# Patient Record
Sex: Female | Born: 1990 | Race: White | Hispanic: No | Marital: Single | Smoking: Never smoker
Health system: Southern US, Community
[De-identification: ages and names within clinical notes are randomized; demographics above are authoritative.]

## PROBLEM LIST (undated history)

## (undated) HISTORY — PX: WISDOM TOOTH EXTRACTION: SHX21

## (undated) HISTORY — PX: TONSILLECTOMY AND ADENOIDECTOMY: SUR1326

## (undated) HISTORY — PX: TYMPANOSTOMY TUBE PLACEMENT: SHX32

---

## 2013-09-05 ENCOUNTER — Emergency Department (HOSPITAL_COMMUNITY): Payer: Managed Care, Other (non HMO)

## 2013-09-05 ENCOUNTER — Encounter (HOSPITAL_COMMUNITY): Payer: Self-pay | Admitting: Emergency Medicine

## 2013-09-05 ENCOUNTER — Emergency Department (HOSPITAL_COMMUNITY)
Admission: EM | Admit: 2013-09-05 | Discharge: 2013-09-05 | Disposition: A | Discharge status: Home / Self Care | Payer: Managed Care, Other (non HMO) | Attending: Emergency Medicine | Admitting: Emergency Medicine

## 2013-09-05 DIAGNOSIS — IMO0002 Reserved for concepts with insufficient information to code with codable children: Secondary | ICD-10-CM | POA: Diagnosis not present

## 2013-09-05 DIAGNOSIS — S0993XA Unspecified injury of face, initial encounter: Secondary | ICD-10-CM | POA: Diagnosis not present

## 2013-09-05 DIAGNOSIS — Y92838 Other recreation area as the place of occurrence of the external cause: Secondary | ICD-10-CM | POA: Diagnosis not present

## 2013-09-05 DIAGNOSIS — S199XXA Unspecified injury of neck, initial encounter: Secondary | ICD-10-CM | POA: Diagnosis not present

## 2013-09-05 DIAGNOSIS — Y9239 Other specified sports and athletic area as the place of occurrence of the external cause: Secondary | ICD-10-CM | POA: Insufficient documentation

## 2013-09-05 DIAGNOSIS — Y9352 Activity, horseback riding: Secondary | ICD-10-CM | POA: Diagnosis not present

## 2013-09-05 DIAGNOSIS — W19XXXA Unspecified fall, initial encounter: Secondary | ICD-10-CM

## 2013-09-05 DIAGNOSIS — S0990XA Unspecified injury of head, initial encounter: Secondary | ICD-10-CM | POA: Diagnosis not present

## 2013-09-05 MED ORDER — OXYCODONE-ACETAMINOPHEN 5-325 MG PO TABS
1.0000 | ORAL_TABLET | Freq: Once | ORAL | Status: AC
  Administered 2013-09-05: 1 via ORAL
  Filled 2013-09-05: qty 1

## 2013-09-05 MED ORDER — HYDROCODONE-ACETAMINOPHEN 5-325 MG PO TABS: 1.0000 | ORAL_TABLET | Freq: Four times a day (QID) | ORAL | Status: AC | PRN

## 2013-09-05 NOTE — ED Notes (Signed)
PT was wearing a hard helmet.

## 2013-09-05 NOTE — ED Notes (Signed)
PT returned from CT

## 2013-09-05 NOTE — ED Provider Notes (Signed)
CSN: [id number]     Arrival date & time 09/05/13  1308 History  This chart was scribed for non-physician practitioner, Santiago Glad, PA-C, working with Mirian Mo, MD by Charline Bills, ED Scribe. This patient was seen in room TR09C/TR09C and the patient's care was started at 2:34 PM.   Chief Complaint  Patient presents with  . Fall   The history is provided by the patient. No language interpreter was used.   HPI Comments: Renee Pollard is a 23 y.o. female who presents to the Emergency Department complaining of fall that occurred at 11 AM today. Pt states that she fell off a horse and hit her head. No LOC. She reports associated coccyx pain, HA, nausea, and fatigue. She also reports that initially after the injury she had neck pain, difficulty speaking, confusion and facial tingling that has since resolved. She denies emesis. Pt reports h/o concussion from similar fall.  She reports that she has been ambulatory since the injury.  Denies extremity pain.   She is currently not on any anticoagulants.    History reviewed. No pertinent past medical history. Past Surgical History  Procedure Laterality Date  . Tonsillectomy and adenoidectomy    . Tympanostomy tube placement    . Wisdom tooth extraction     History reviewed. No pertinent family history. History  Substance Use Topics  . Smoking status: Never Smoker   . Smokeless tobacco: Not on file  . Alcohol Use: Yes     Comment: 4/wk   OB History   Grav Para Term Preterm Abortions TAB SAB Ect Mult Living                 Review of Systems  Constitutional: Positive for fatigue.  Eyes: Positive for visual disturbance.  Gastrointestinal: Positive for nausea. Negative for vomiting.  Musculoskeletal: Positive for myalgias and neck pain (prior).  Neurological: Positive for speech difficulty (resolved) and headaches. Negative for syncope.  Psychiatric/Behavioral: Positive for confusion (resolved).  All other systems reviewed and are  negative.  Allergies  Review of patient's allergies indicates no known allergies.  Home Medications   Prior to Admission medications   Not on File   Triage Vitals: BP 139/79  Pulse 71  Temp(Src) 97.9 F (36.6 C) (Oral)  SpO2 99%  LMP 08/14/2013 Physical Exam  Nursing note and vitals reviewed. Constitutional: She is oriented to person, place, and time. She appears well-developed and well-nourished.  HENT:  Head: Normocephalic and atraumatic.  Eyes: Conjunctivae and EOM are normal. Pupils are equal, round, and reactive to light.  Neck: Normal range of motion. Neck supple.  Cardiovascular: Normal rate, regular rhythm and normal heart sounds.   Pulmonary/Chest: Effort normal and breath sounds normal.  Musculoskeletal: Normal range of motion.       Cervical back: Normal. She exhibits normal range of motion, no tenderness, no bony tenderness, no swelling, no edema and no deformity.       Thoracic back: Normal. She exhibits normal range of motion, no tenderness, no bony tenderness, no swelling, no edema and no deformity.       Lumbar back: Normal. She exhibits normal range of motion, no tenderness, no bony tenderness, no swelling, no edema and no deformity.  No tenderness to palpation over cervical, thoracic and lumbar spine Tender to palpation over coccyx   Neurological: She is alert and oriented to person, place, and time. No cranial nerve deficit.  Distal sensation intact bilaterally  Muscle strength is normal in upper and lower extremities  Cranial nerves intact  Skin: Skin is warm and dry.  Psychiatric: She has a normal mood and affect. Her behavior is normal.   ED Course  Procedures (including critical care time) DIAGNOSTIC STUDIES: Oxygen Saturation is 99% on RA, normal by my interpretation.    COORDINATION OF CARE: 2:40 PM-Discussed treatment plan which includes CT and XR with pt at bedside and pt agreed to plan.   Labs Review Labs Reviewed - No data to  display  Imaging Review Dg Sacrum/coccyx  09/05/2013   CLINICAL DATA:  Fall from horse with sacral pain.  EXAM: SACRUM AND COCCYX - 2+ VIEW  COMPARISON:  None.  FINDINGS: The sacrum and coccyx show normal alignment without evidence of fracture. Sacroiliac joints are symmetric and normal. No bony lesions are seen. Soft tissues are unremarkable.  IMPRESSION: No evidence of acute fracture involving the sacrum or coccyx.   Electronically Signed   By: Irish Lack M.D.   On: 09/05/2013 15:33   Ct Head Wo Contrast  09/05/2013   CLINICAL DATA:  Status post fall striking her head without loss of consciousness; peripheral visual disturbance as well as headache and nausea  EXAM: CT HEAD WITHOUT CONTRAST  TECHNIQUE: Contiguous axial images were obtained from the base of the skull through the vertex without intravenous contrast.  COMPARISON:  None.  FINDINGS: The ventricles are normal in size and position. There is no intracranial hemorrhage nor intracranial mass effect. No acute ischemic changes are demonstrated. The cerebellum and brainstem are normal.  The observed paranasal sinuses and mastoid air cells are clear. There is no acute skull fracture. There is no cephalohematoma.  IMPRESSION: There is no acute intracranial abnormality.   Electronically Signed   By: David  [place]   On: 09/05/2013 15:25    EKG Interpretation None      MDM   Final diagnoses:  None   Patient presenting with headache and coccyx pain after falling off of a horse.  Normal neurological exam.  Head CT is negative.  Coccyx xray negative.  Patient able to ambulate.  Patient stable for discharge.  Return precautions given.    I personally performed the services described in this documentation, which was scribed in my presence. The recorded information has been reviewed and is accurate.    Santiago Glad, PA-C 09/05/13 1601

## 2013-09-05 NOTE — ED Notes (Signed)
Pt arrives via POV from with fall this AM from a horse. Pt denies LOC with the fall. States while driving she missed several turns, was having difficulty focusing while texting. Reports symptoms have improved while waiting here. States she's nauseated, denies vomiting. Hx of concussion. Currently awake, alert, oriented x4, VSS.

## 2013-09-05 NOTE — ED Notes (Signed)
PT ambulated with baseline gait; VSS; A&Ox3; no signs of distress; respirations even and unlabored; skin warm and dry; no questions upon discharge.  

## 2013-09-06 NOTE — ED Provider Notes (Signed)
Medical screening examination/treatment/procedure(s) were performed by non-physician practitioner and as supervising physician I was immediately available for consultation/collaboration.   EKG Interpretation None        Clinton Wahlberg, MD 09/06/13 1426 

## 2013-09-08 ENCOUNTER — Emergency Department (HOSPITAL_COMMUNITY)
Admission: EM | Admit: 2013-09-08 | Discharge: 2013-09-08 | Disposition: A | Discharge status: Home / Self Care | Payer: Managed Care, Other (non HMO) | Attending: Emergency Medicine | Admitting: Emergency Medicine

## 2013-09-08 ENCOUNTER — Encounter (HOSPITAL_COMMUNITY): Payer: Self-pay | Admitting: Emergency Medicine

## 2013-09-08 DIAGNOSIS — Z87828 Personal history of other (healed) physical injury and trauma: Secondary | ICD-10-CM | POA: Diagnosis not present

## 2013-09-08 DIAGNOSIS — H539 Unspecified visual disturbance: Secondary | ICD-10-CM | POA: Diagnosis not present

## 2013-09-08 DIAGNOSIS — Z09 Encounter for follow-up examination after completed treatment for conditions other than malignant neoplasm: Secondary | ICD-10-CM | POA: Diagnosis present

## 2013-09-08 NOTE — ED Notes (Signed)
Pt placed into room. Pt changing into gown. Elon Jester, RN made aware pt is in room.

## 2013-09-08 NOTE — ED Provider Notes (Signed)
CSN: [id number]     Arrival date & time 09/08/13  1059 History   First MD Initiated Contact with Patient 09/08/13 1135     Chief Complaint  Patient presents with  . Follow-up     (Consider location/radiation/quality/duration/timing/severity/associated sxs/prior Treatment) The history is provided by the patient and medical records.   This is a 23 year old female with no significant past medical history presenting to the ED for visual disturbance. Patient states she was seen in the ED on 09/05/2013 after falling off of horse and hitting the back of her head. She denied loss of consciousness. She had a CT performed at that time which was negative and told that she likely had a concussion. States that this time she has been having intermittent visual changes in her right eye.  States "i don't feel like my right eye is working like my left eye.  It feels like it is straining."  Denies acute vision loss.  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  States eye feels more strained when reading or trying to focus, ie- reading text book or texting on cell phone.  She does have a current headache but has not taken her pain medication today.  She denies and tinnitus, confusion, changes in speech, difficulty walking, aphasia, fever, chills, or neck pain.  Patient not currently on any anti-coagulants.  VS stable on arrival.  History reviewed. No pertinent past medical history. Past Surgical History  Procedure Laterality Date  . Tonsillectomy and adenoidectomy    . Tympanostomy tube placement    . Wisdom tooth extraction     No family history on file. History  Substance Use Topics  . Smoking status: Never Smoker   . Smokeless tobacco: Not on file  . Alcohol Use: Yes     Comment: 4/wk   OB History   Grav Para Term Preterm Abortions TAB SAB Ect Mult Living                 Review of Systems  Eyes: Positive for  visual disturbance.  All other systems reviewed and are negative.     Allergies  Review of patient's allergies indicates no known allergies.  Home Medications   Prior to Admission medications   Medication Sig Start Date End Date Taking? Authorizing Provider  HYDROcodone-acetaminophen (NORCO/VICODIN) 5-325 MG per tablet Take 1-2 tablets by mouth every 6 (six) hours as needed. 09/05/13   Heather Laisure, PA-C   BP 139/85  Pulse 71  Temp(Src) 98.5 F (36.9 C) (Oral)  Resp 20  Ht  (1.575 m)  Wt 135 lb (61.236 kg)  BMI 24.69 kg/m2  SpO2 99%  LMP 08/14/2013  Physical Exam  Nursing note and vitals reviewed. Constitutional: She is oriented to person, place, and time. She appears well-developed and well-nourished.  HENT:  Head: Normocephalic and atraumatic.  Mouth/Throat: Oropharynx is clear and moist.  Eyes: Conjunctivae, EOM and lids are normal. Pupils are equal, round, and reactive to light.  PERRL, EOM intact and non-painful, normal confrontation bilaterally, no noted visual field cuts, visual acuity Bilateral Near: 20/20 ; Bilateral Distance: 20/20 ; R Near: 20/40 ; R Distance: 20/40 ; L Near: 20/40 ; L Distance: 20/40  Neck: Normal range of motion.  Cardiovascular: Normal rate, regular rhythm and normal heart sounds.   Pulmonary/Chest: Effort normal and breath sounds normal. No respiratory distress. She has no wheezes.  Abdominal: Soft. Bowel sounds are normal. There is no tenderness. There is no guarding.  Musculoskeletal: Normal range of motion.  Neurological: She is alert and oriented to person, place, and time.  AAOx3, answering questions appropriately; equal strength UE and LE bilaterally; CN grossly intact; moves all extremities appropriately without ataxia; no focal neuro deficits or facial asymmetry appreciated  Skin: Skin is warm and dry.  Psychiatric: She has a normal mood and affect.    ED Course  Procedures (including critical care time) Labs Review Labs  Reviewed - No data to display  Imaging Review No results found.   EKG Interpretation None      MDM   Final diagnoses:  Visual disturbance of one eye   23 year old female 3 days status post head injury. Negative CT on initial visit, likely concussion. Complains of intermittent right eye strain since accident, worse with attempting to read book or texts messages.  No loss of vision noted. Neurologic exam today is nonfocal. She has no visual field cuts and has normal visual acuity.  Do not feel further work-up indicated at this time. Patient does wear contact lenses, but has not been evaluated an ophthalmologist since moving to [facility] for college. I have referred her to a local clinic for further evaluation if symptoms persist.  Discussed plan with patient, he/she acknowledged understanding and agreed with plan of care.  Return precautions given for new or worsening symptoms.  Garlon Hatchet, PA-C 09/08/13 1444

## 2013-09-08 NOTE — ED Provider Notes (Signed)
Medical screening examination/treatment/procedure(s) were performed by non-physician practitioner and as supervising physician I was immediately available for consultation/collaboration.   EKG Interpretation None        Joya Gaskins, MD 09/08/13 1504

## 2013-09-08 NOTE — ED Notes (Signed)
Patient states she was here last week after falling from horse and hitting the back of her head.  Patient states that she had a concussion and since that time has had vision changes with R eye.   Patient states "my eyes aren't working together".   Patient states still has bad headache, but is frontal headache at this time.

## 2013-09-08 NOTE — Discharge Instructions (Signed)
Follow-up with Dr. Charlotte Sanes if you continue having problems with your right eye. Return to the ED for new concerns.

## 2013-09-08 NOTE — ED Notes (Signed)
Pt upset that provider would not write pt can not take school test tomorrow pt was given a school note with no restrictions

## 2016-03-12 IMAGING — CR DG SACRUM/COCCYX 2+V
3 series · 3 of 3 positions shown · non-contrast
Comparison: None.

CLINICAL DATA: Fall from horse with sacral pain.

EXAM:
SACRUM AND COCCYX - 2+ VIEW

[t sacrum a.p.]
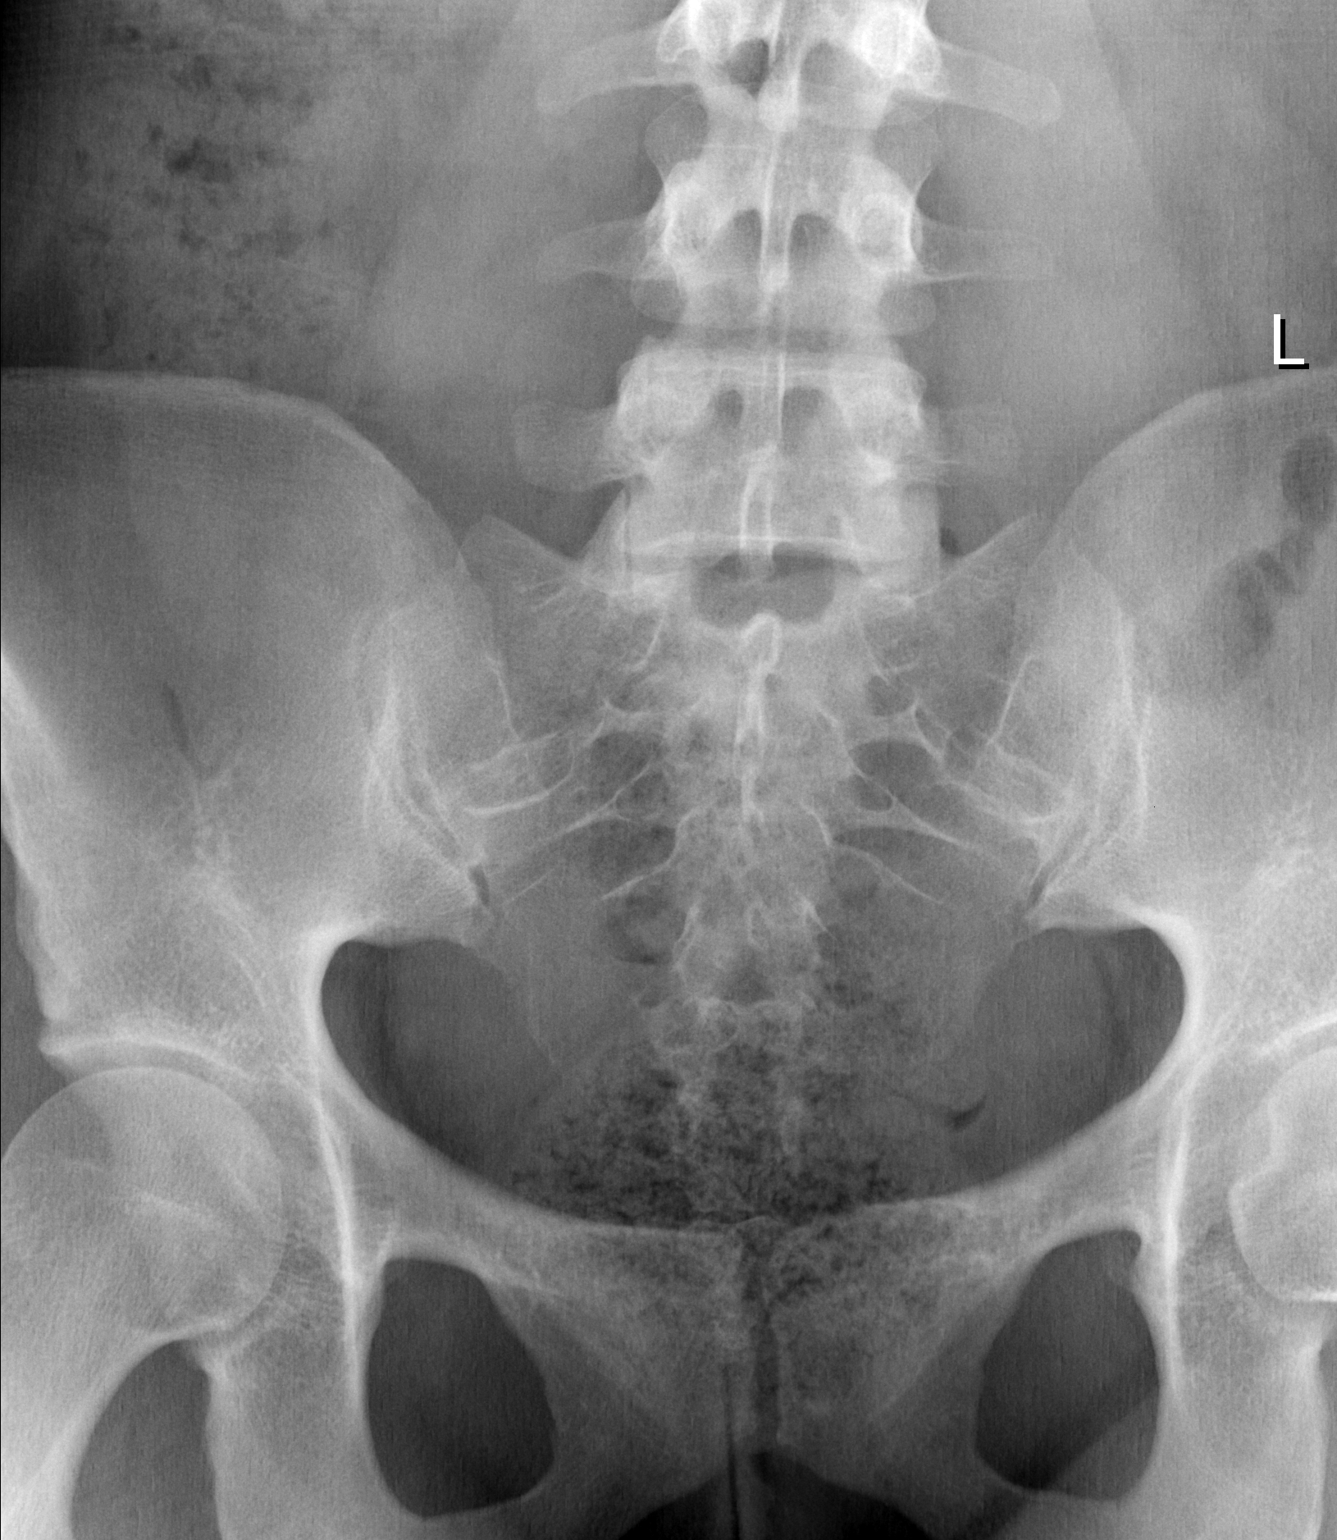

[t sacrum lat]
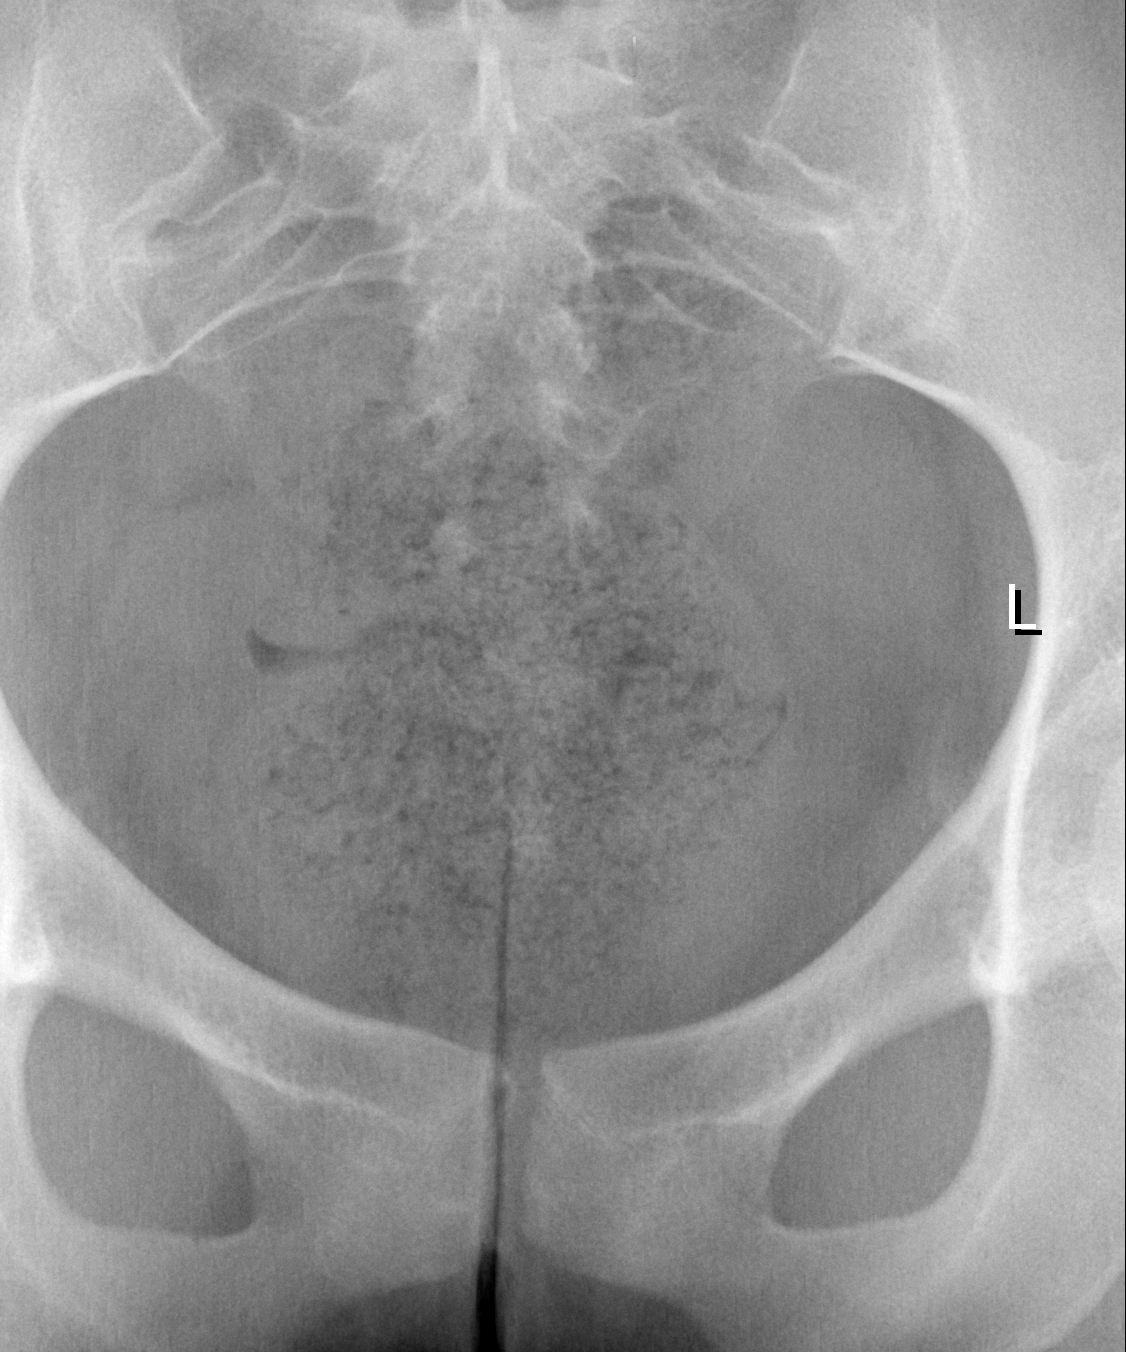

[t coccyx a.p.]
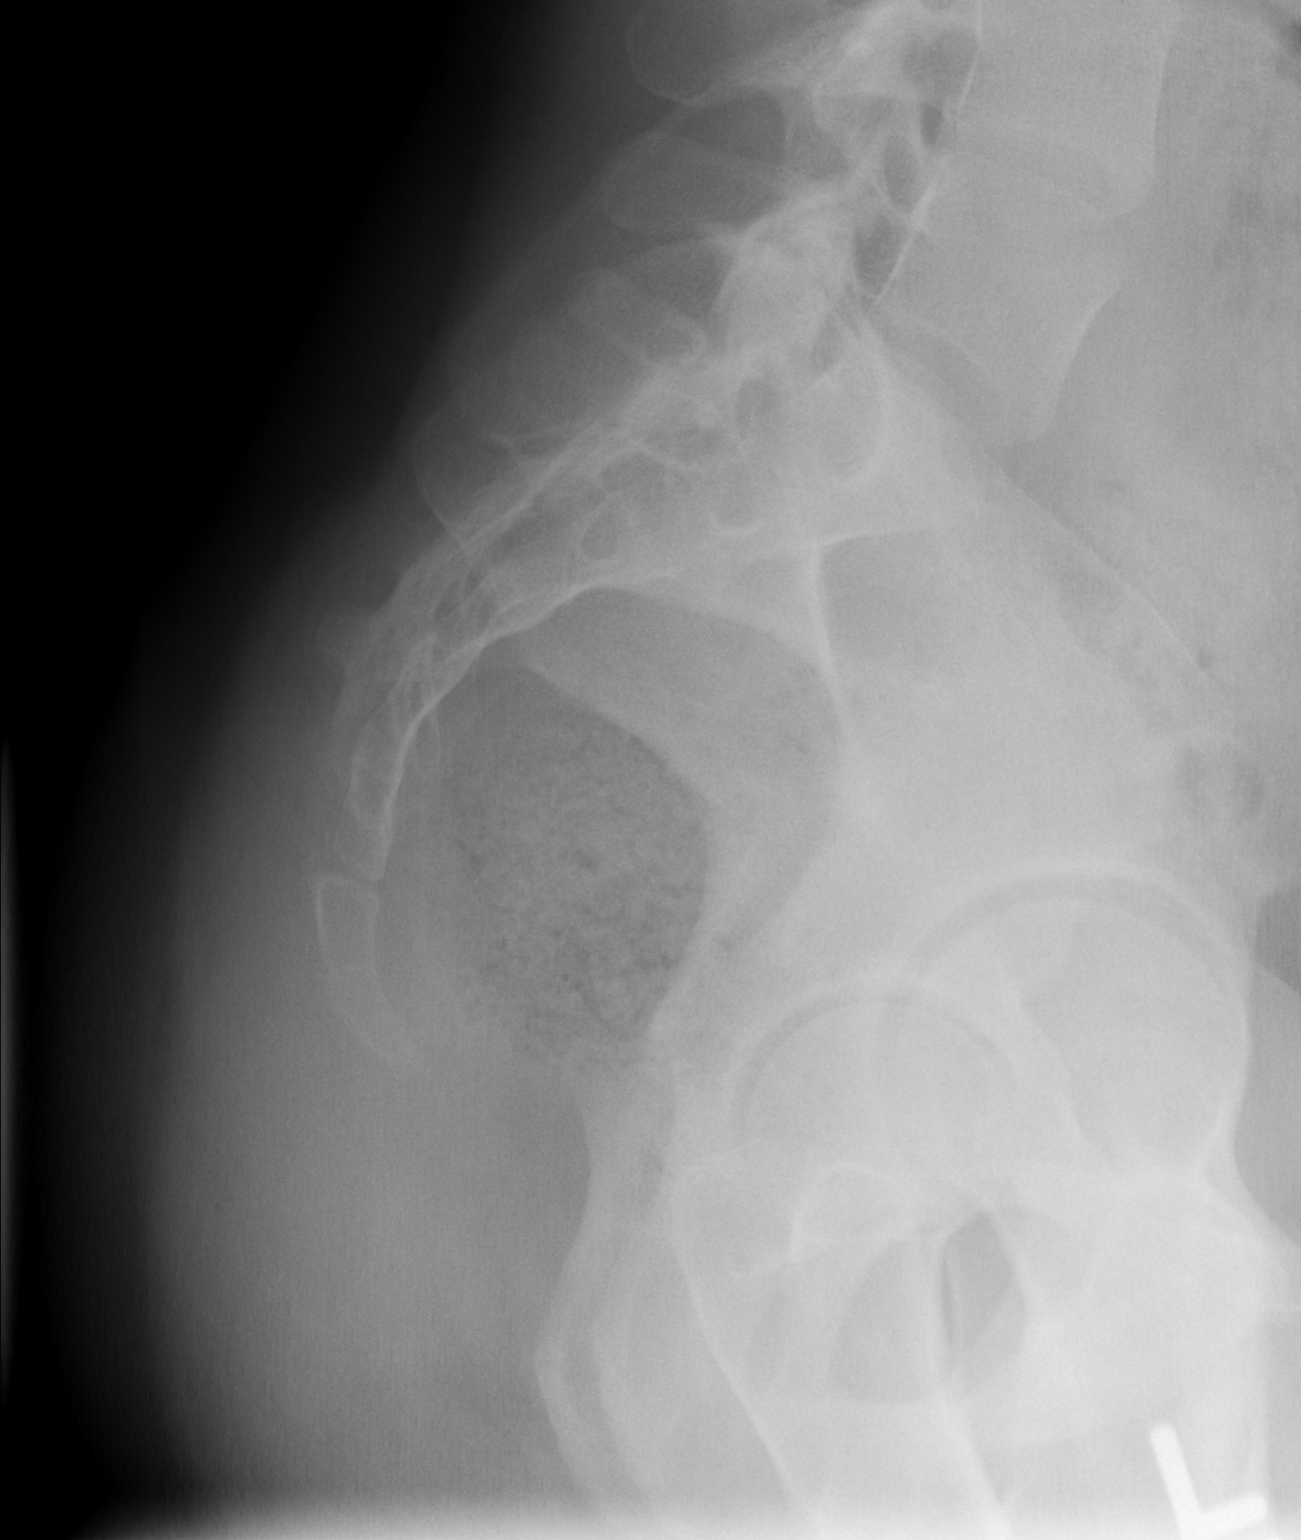

[3 of 3 positions shown; findings below may reference images not displayed]

FINDINGS: The sacrum and coccyx show normal alignment without evidence of
fracture. Sacroiliac joints are symmetric and normal. No bony
lesions are seen. Soft tissues are unremarkable.
IMPRESSION: No evidence of acute fracture involving the sacrum or coccyx.
